# Patient Record
Sex: Female | Born: 1980 | Race: White | Hispanic: No | Marital: Single | Smoking: Former smoker
Health system: Southern US, Community
[De-identification: ages and names within clinical notes are randomized; demographics above are authoritative.]

## PROBLEM LIST (undated history)

## (undated) DIAGNOSIS — R569 Unspecified convulsions: Secondary | ICD-10-CM

## (undated) DIAGNOSIS — N289 Disorder of kidney and ureter, unspecified: Secondary | ICD-10-CM

## (undated) HISTORY — PX: TUMOR REMOVAL: SHX12

## (undated) HISTORY — PX: KIDNEY SURGERY: SHX687

## (undated) HISTORY — PX: CHOLECYSTECTOMY: SHX55

## (undated) HISTORY — PX: KIDNEY STONE SURGERY: SHX686

---

## 2014-10-06 ENCOUNTER — Encounter: Payer: Self-pay | Admitting: Emergency Medicine

## 2014-10-06 ENCOUNTER — Emergency Department
Admission: EM | Admit: 2014-10-06 | Discharge: 2014-10-06 | Disposition: A | Discharge status: Home / Self Care | Payer: Medicaid - Out of State | Attending: Emergency Medicine | Admitting: Emergency Medicine

## 2014-10-06 ENCOUNTER — Emergency Department: Payer: Medicaid - Out of State

## 2014-10-06 DIAGNOSIS — M79605 Pain in left leg: Secondary | ICD-10-CM | POA: Insufficient documentation

## 2014-10-06 DIAGNOSIS — Z86718 Personal history of other venous thrombosis and embolism: Secondary | ICD-10-CM | POA: Insufficient documentation

## 2014-10-06 DIAGNOSIS — Z87891 Personal history of nicotine dependence: Secondary | ICD-10-CM | POA: Insufficient documentation

## 2014-10-06 DIAGNOSIS — R2242 Localized swelling, mass and lump, left lower limb: Secondary | ICD-10-CM | POA: Diagnosis present

## 2014-10-06 HISTORY — DX: Unspecified convulsions: R56.9

## 2014-10-06 HISTORY — DX: Disorder of kidney and ureter, unspecified: N28.9

## 2014-10-06 NOTE — ED Notes (Signed)
Pt states she began noticing left calf swelling and tightness 4 days ago, has a hx of blood clot in the same leg in the past.

## 2014-10-06 NOTE — Discharge Instructions (Signed)
Please seek medical attention for any high fevers, chest pain, shortness of breath, change in behavior, persistent vomiting, bloody stool or any other new or concerning symptoms. ° °Musculoskeletal Pain °Musculoskeletal pain is muscle and boney aches and pains. These pains can occur in any part of the body. Your caregiver may treat you without knowing the cause of the pain. They may treat you if blood or urine tests, X-rays, and other tests were normal.  °CAUSES °There is often not a definite cause or reason for these pains. These pains may be caused by a type of germ (virus). The discomfort may also come from overuse. Overuse includes working out too hard when your body is not fit. Boney aches also come from weather changes. Bone is sensitive to atmospheric pressure changes. °HOME CARE INSTRUCTIONS  °· Ask when your test results will be ready. Make sure you get your test results. °· Only take over-the-counter or prescription medicines for pain, discomfort, or fever as directed by your caregiver. If you were given medications for your condition, do not drive, operate machinery or power tools, or sign legal documents for 24 hours. Do not drink alcohol. Do not take sleeping pills or other medications that may interfere with treatment. °· Continue all activities unless the activities cause more pain. When the pain lessens, slowly resume normal activities. Gradually increase the intensity and duration of the activities or exercise. °· During periods of severe pain, bed rest may be helpful. Lay or sit in any position that is comfortable. °· Putting ice on the injured area. °¨ Put ice in a bag. °¨ Place a towel between your skin and the bag. °¨ Leave the ice on for 15 to 20 minutes, 3 to 4 times a day. °· Follow up with your caregiver for continued problems and no reason can be found for the pain. If the pain becomes worse or does not go away, it may be necessary to repeat tests or do additional testing. Your caregiver  may need to look further for a possible cause. °SEEK IMMEDIATE MEDICAL CARE IF: °· You have pain that is getting worse and is not relieved by medications. °· You develop chest pain that is associated with shortness or breath, sweating, feeling sick to your stomach (nauseous), or throw up (vomit). °· Your pain becomes localized to the abdomen. °· You develop any new symptoms that seem different or that concern you. °MAKE SURE YOU:  °· Understand these instructions. °· Will watch your condition. °· Will get help right away if you are not doing well or get worse. °Document Released: 04/15/2005 Document Revised: 07/08/2011 Document Reviewed: 12/18/2012 °[employer]® Patient Information ©2015 [employer]. This information is not intended to replace advice given to you by your health care provider. Make sure you discuss any questions you have with your health care provider. ° °

## 2014-10-06 NOTE — ED Notes (Signed)
Pt reports swelling in left calf four days ago. Pt recently traveled on an air plane for over six hours. 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 Pt does  Not take any anticoags at this time. Denies any pain to left leg at this time. No swelling or redness noted.

## 2014-10-06 NOTE — ED Notes (Signed)
Per pt wants to wait to see edp prior to blood work. edp aware.

## 2014-10-06 NOTE — ED Provider Notes (Signed)
[facility] Emergency Department Provider Note    ____________________________________________  Time seen: 2130  I have reviewed the triage vital signs and the nursing notes.   HISTORY  Chief Complaint Leg Swelling   History limited by: Not Limited   HPI Meredith Morales is a 34 y.o. female who presents to the emergency department with left calf swelling and tightness. This is been going on for 4 days. 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 Her symptoms started after she went on a 6 hour plane ride from [place] to [place].She denies any shortness of breath, chest pain or cough.     Past Medical History  Diagnosis Date  . Seizures   . Renal disorder     There are no active problems to display for this patient.   Past Surgical History  Procedure Laterality Date  . Tumor removal      left leg inner thigh  . Cholecystectomy    . Kidney surgery      stent placed  . Kidney stone surgery      No current outpatient prescriptions on file.  Allergies Azithromycin; Codeine; Morphine and related; and Sulfa antibiotics  No family history on file.  Social History History  Substance Use Topics  . Smoking status: Former Games developer  . Smokeless tobacco: Never Used  . Alcohol Use: No    Review of Systems  Constitutional: Negative for fever. Cardiovascular: Negative for chest pain. Respiratory: Negative for shortness of breath. Gastrointestinal: Negative for abdominal pain, vomiting and diarrhea. Genitourinary: Negative for dysuria. Musculoskeletal: Negative for back pain. Left calf tightness and swelling. Skin: Negative for rash. Neurological: Negative for headaches, focal weakness or numbness.   10-point ROS otherwise negative.  ____________________________________________   PHYSICAL EXAM:  VITAL SIGNS: ED Triage Vitals  Enc Vitals Group     BP 10/06/14 0839 133/97 mmHg     Pulse Rate 10/06/14 0839 73      Resp 10/06/14 0839 18     Temp 10/06/14 0839 98 F (36.7 C)     Temp Source 10/06/14 0839 Oral     SpO2 10/06/14 0839 99 %     Weight 10/06/14 0839 169 lb (76.658 kg)     Height 10/06/14 0839 5\' 1"  (1.549 m)   Constitutional: Alert and oriented. Well appearing and in no distress. Eyes: Conjunctivae are normal. PERRL. Normal extraocular movements. ENT   Head: Normocephalic and atraumatic.   Nose: No congestion/rhinnorhea.   Mouth/Throat: Mucous membranes are moist.   Neck: No stridor. Hematological/Lymphatic/Immunilogical: No cervical lymphadenopathy. Cardiovascular: Normal rate, regular rhythm.  No murmurs, rubs, or gallops. Respiratory: Normal respiratory effort without tachypnea nor retractions. Breath sounds are clear and equal bilaterally. No wheezes/rales/rhonchi. Gastrointestinal: Soft and nontender. No distention.  Genitourinary: Deferred Musculoskeletal: Normal range of motion in all extremities. No joint effusions.  No lower extremity tenderness nor edema. Neurologic:  Normal speech and language. No gross focal neurologic deficits are appreciated. Speech is normal.  Skin:  Skin is warm, dry and intact. No rash noted. Psychiatric: Mood and affect are normal. Speech and behavior are normal. Patient exhibits appropriate insight and judgment.  ____________________________________________    LABS (pertinent positives/negatives)  None  ____________________________________________   EKG  None  ____________________________________________    RADIOLOGY  Lower extremity doppler IMPRESSION: No evidence of deep venous thrombosis. ____________________________________________   PROCEDURES  Procedure(s) performed: None  Critical Care performed: No  ____________________________________________   INITIAL IMPRESSION / ASSESSMENT AND PLAN /  ED COURSE  Pertinent labs & imaging results that were available during my care of the patient were reviewed by  me and considered in my medical decision making (see chart for details).   Patient with left calf tightness and swelling. History of DVTs. Had recent travel. On PE no clear swelling, no tenderness, however will get [place] given hx.   ----------------------------------------- 11:46 AM on 10/06/2014 -----------------------------------------  No evidence of DVT. Will discharge home  ____________________________________________   FINAL CLINICAL IMPRESSION(S) / ED DIAGNOSES  Final diagnoses:  Pain of left lower extremity     Phineas Semen, MD 10/06/14 1146

## 2014-10-06 NOTE — ED Notes (Signed)
Patient transported to Ultrasound 

## 2016-12-14 IMAGING — US US EXTREM LOW VENOUS*L*
1 series · 13 of 24 positions shown · non-contrast
Comparison: None.

CLINICAL DATA: Acute left leg swelling for 3 days. Remote history
of DVT.



[Series 1: us extrem low venous*left* · 0.08mm/px · 13 of 37 slices shown]
[im 1/37]
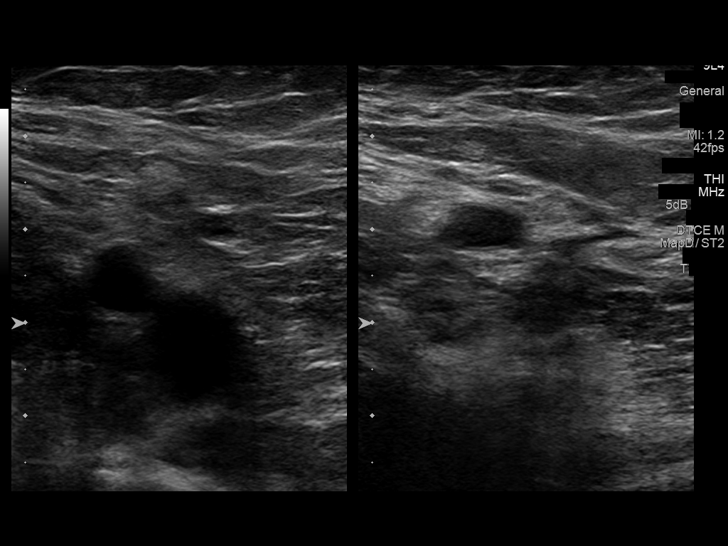
[im 4/37]
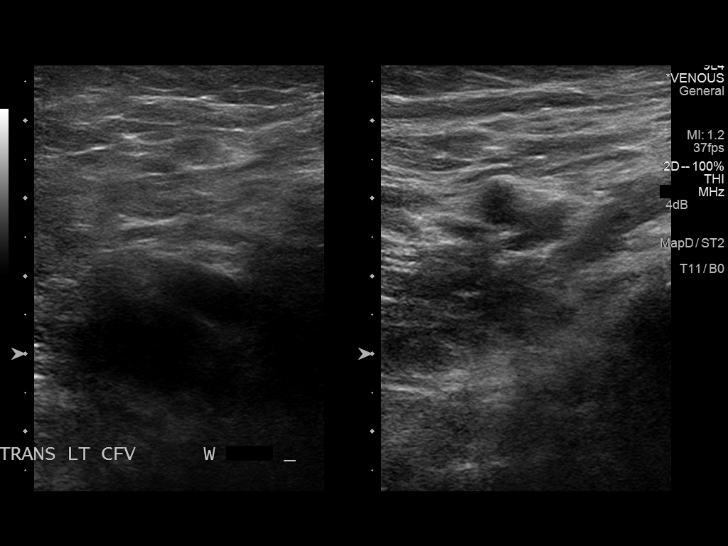
[im 7/37]
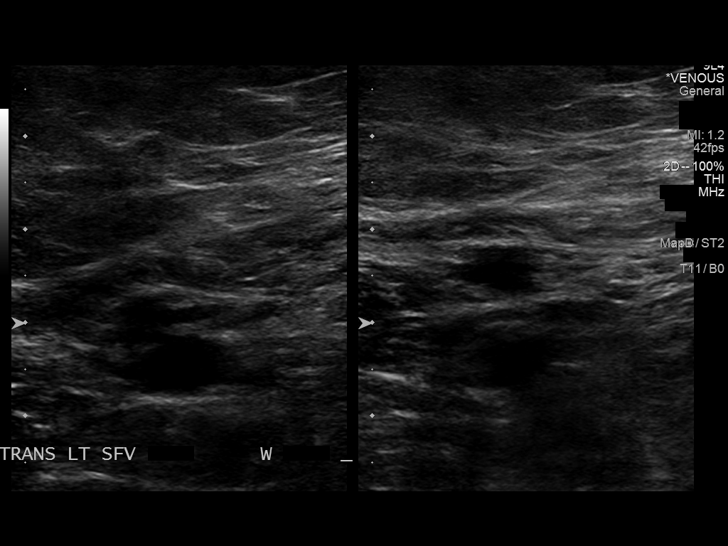
[im 10/37]
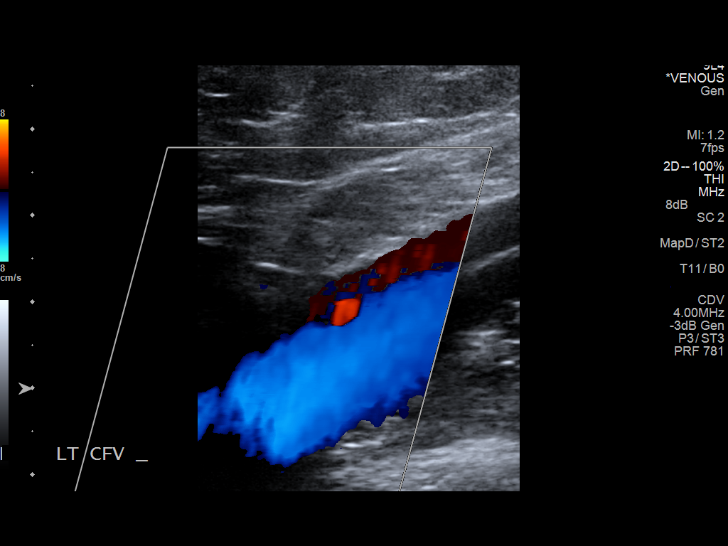
[im 13/37]
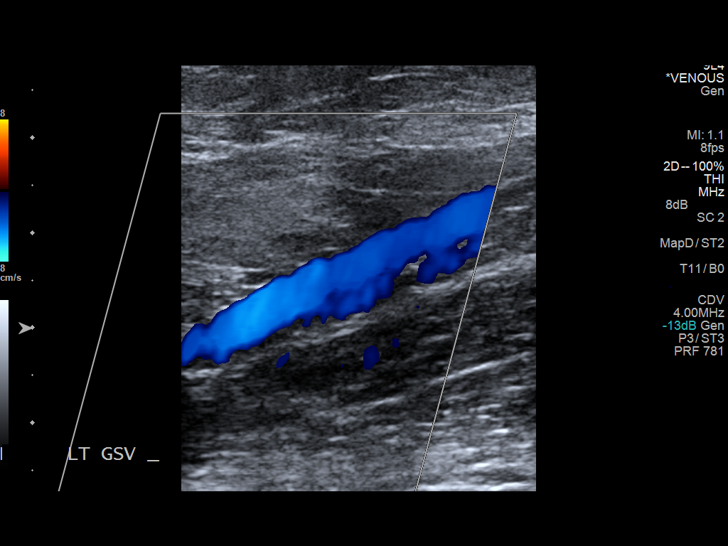
[im 16/37]
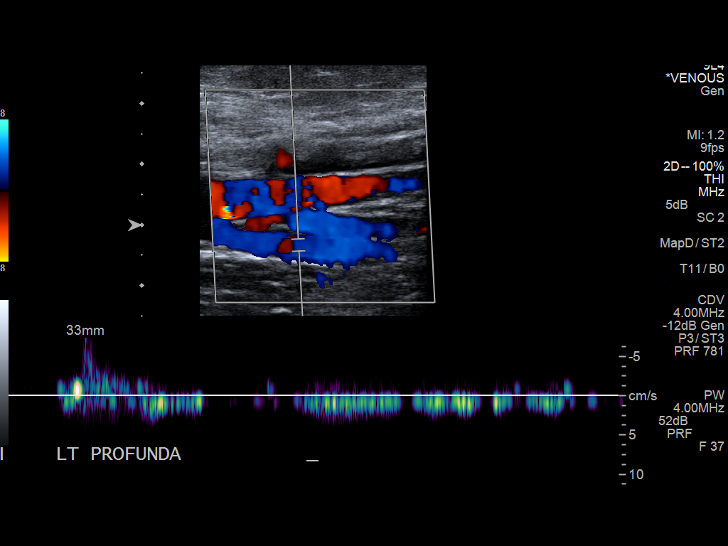
[im 19/37]
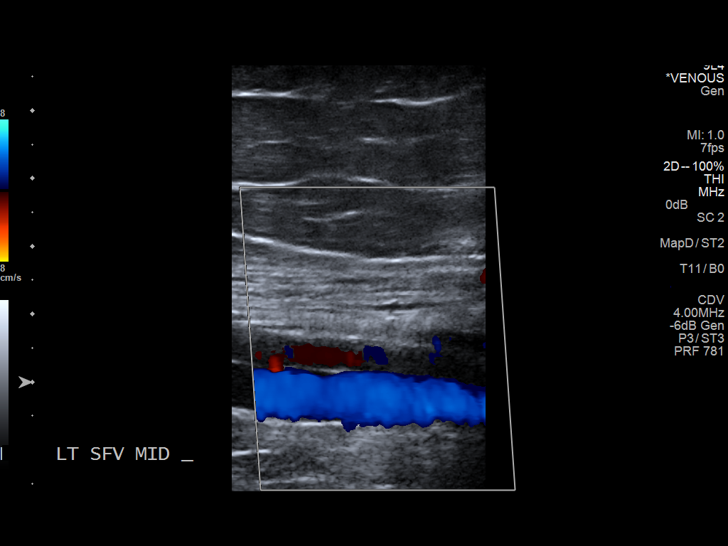
[im 21/37]
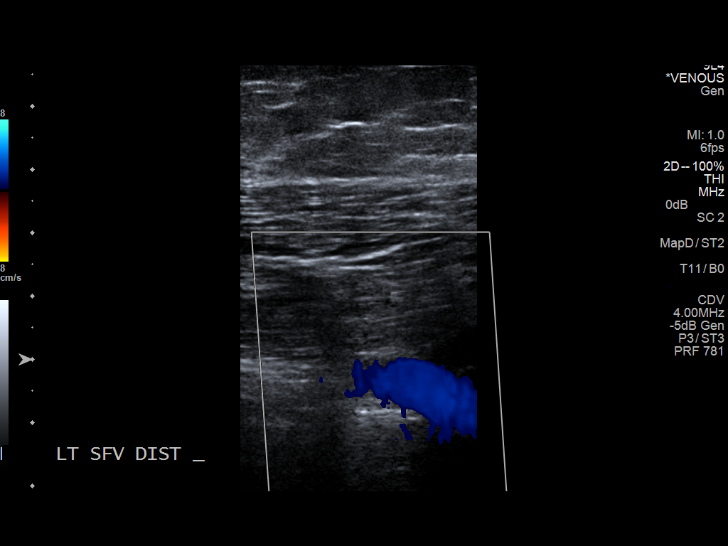
[im 24/37]
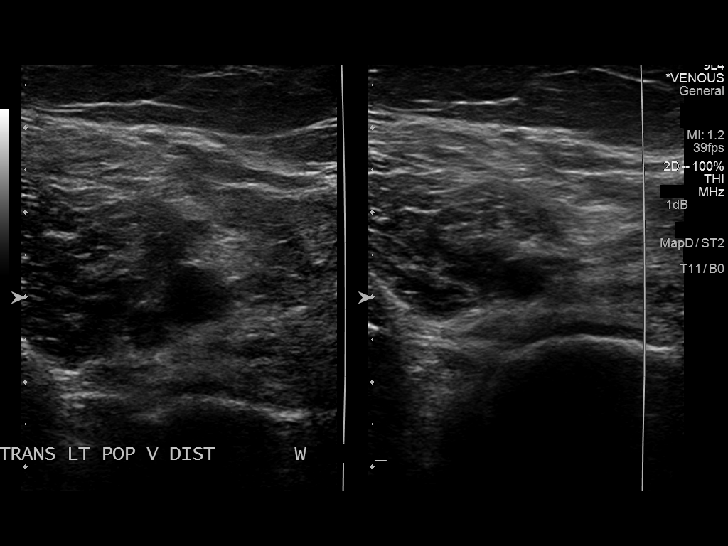
[im 27/37]
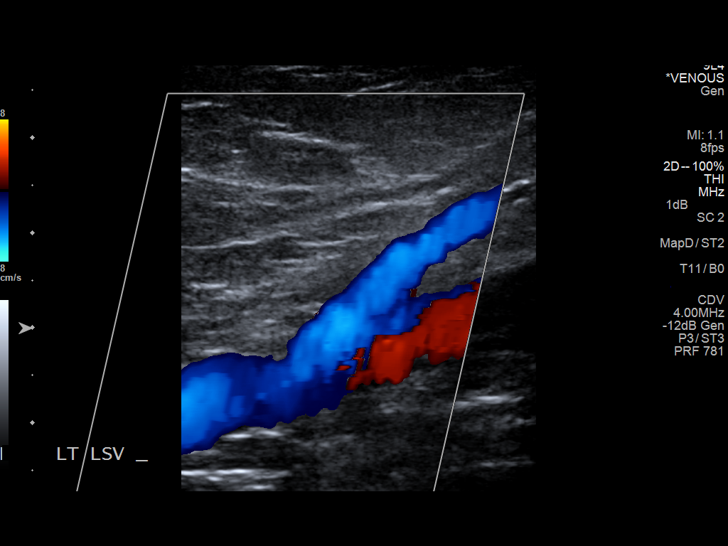
[im 30/37]
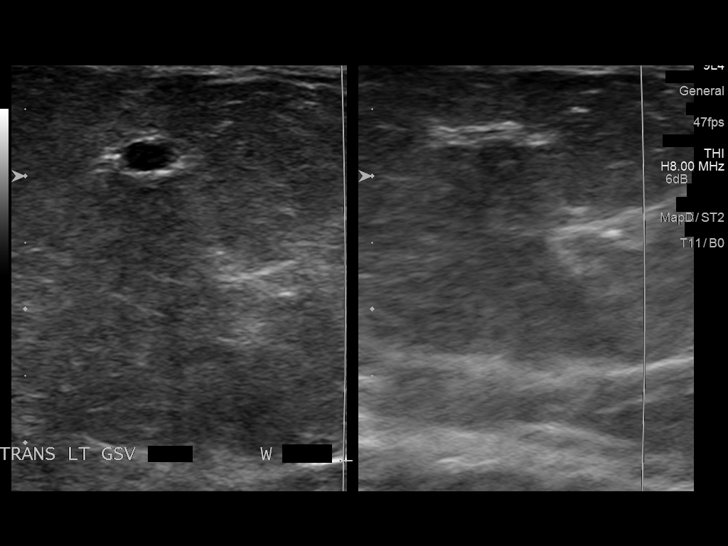
[im 33/37]
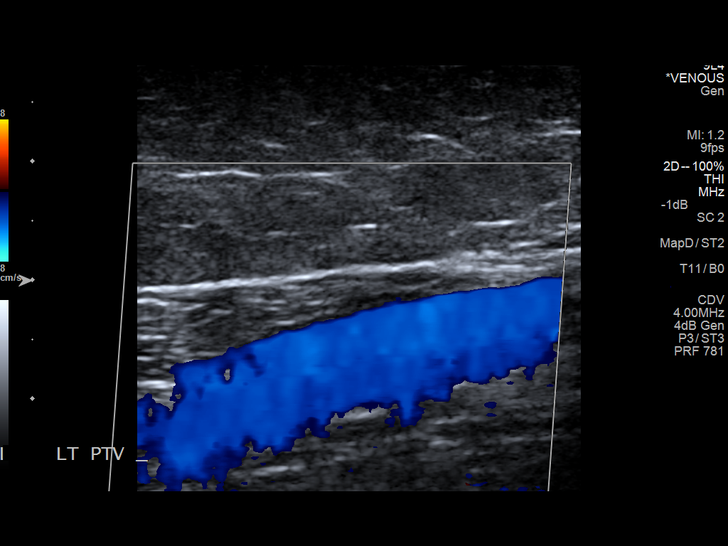
[im 37/37]
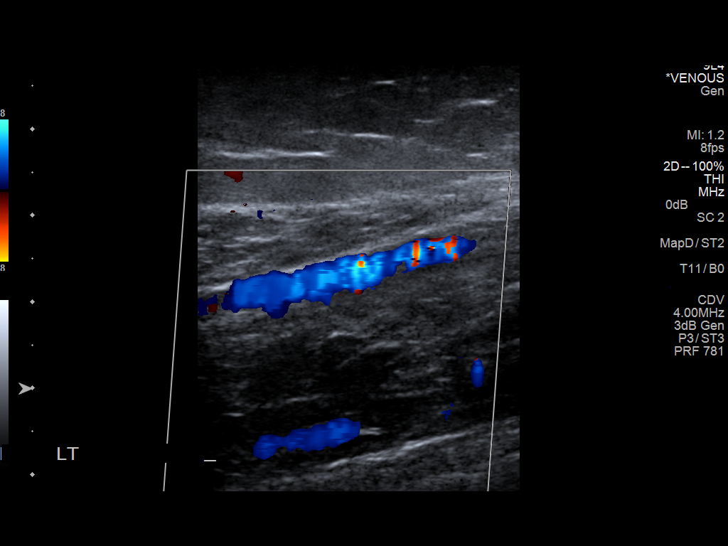

[13 of 24 positions shown; findings below may reference images not displayed]

FINDINGS: Contralateral Common Femoral Vein: Respiratory phasicity is normal
and symmetric with the symptomatic side. No evidence of thrombus.
Normal compressibility.

Common Femoral Vein: No evidence of thrombus. Normal
compressibility, respiratory phasicity and response to augmentation.

Saphenofemoral Junction: No evidence of thrombus. Normal
compressibility and flow on color Doppler imaging.

Profunda Femoral Vein: No evidence of thrombus. Normal
compressibility and flow on color Doppler imaging.

Femoral Vein: No evidence of thrombus. Normal compressibility,
respiratory phasicity and response to augmentation.

Popliteal Vein: No evidence of thrombus. Normal compressibility,
respiratory phasicity and response to augmentation.

Calf Veins: No evidence of thrombus. Normal compressibility and flow
on color Doppler imaging.

Superficial Great Saphenous Vein: No evidence of thrombus. Normal
compressibility and flow on color Doppler imaging.

Venous Reflux:  None.

Other Findings:  None.
IMPRESSION: No evidence of deep venous thrombosis.
